# Patient Record
Sex: Male | Born: 2000 | Race: Black or African American | Hispanic: No | Marital: Single | Smoking: Never smoker
Health system: Southern US, Community
[De-identification: ages and names within clinical notes are randomized; demographics above are authoritative.]

## PROBLEM LIST (undated history)

## (undated) DIAGNOSIS — J45909 Unspecified asthma, uncomplicated: Secondary | ICD-10-CM

---
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
## 2018-03-28 IMAGING — US US ART/VEN ABD/PELV/SCROTUM DOPPLER LTD
1 series · 13 of 25 positions shown · non-contrast
Comparison: 01/18/2016

CLINICAL DATA: Scrotal pain for 6 days. Prior right testicular
torsion.

EXAM:
SCROTAL ULTRASOUND
DOPPLER ULTRASOUND OF THE TESTICLES
TECHNIQUE: Complete ultrasound examination of the testicles, epididymis, and
other scrotal structures was performed. Color and spectral Doppler
ultrasound were also utilized to evaluate blood flow to the
testicles.

[Series 1: us art/ven abd/pelv/scrotum doppler ltd · 0.06mm/px · 13 of 56 slices shown]
[im 1/56]
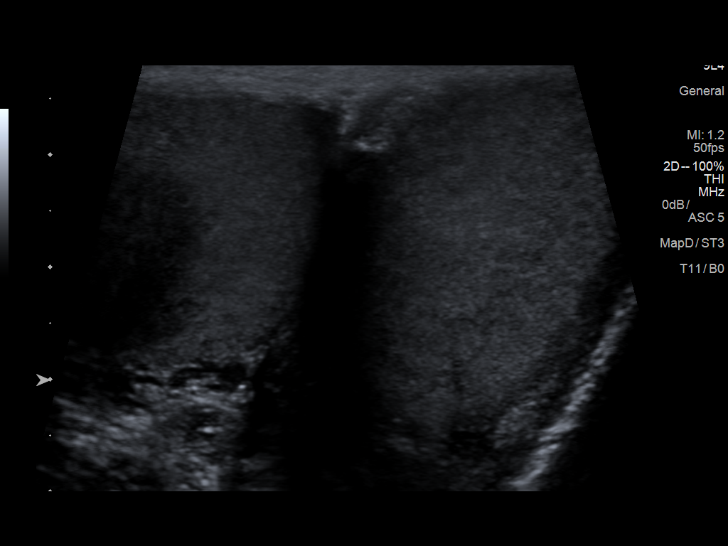
[im 5/56]
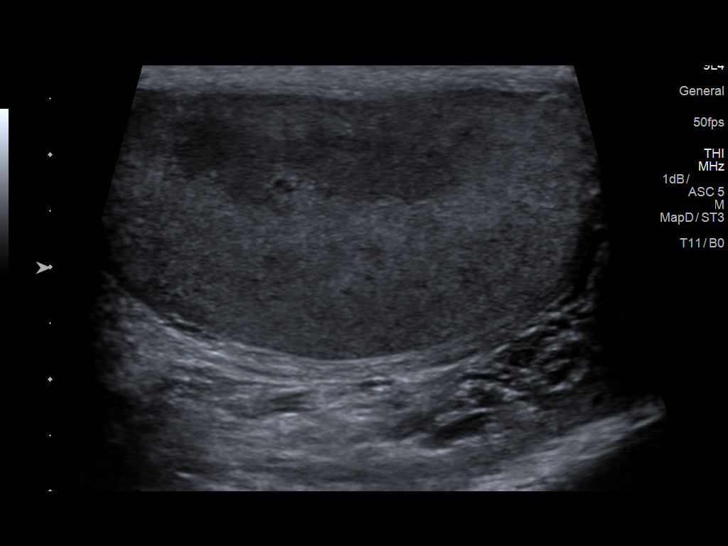
[im 10/56]
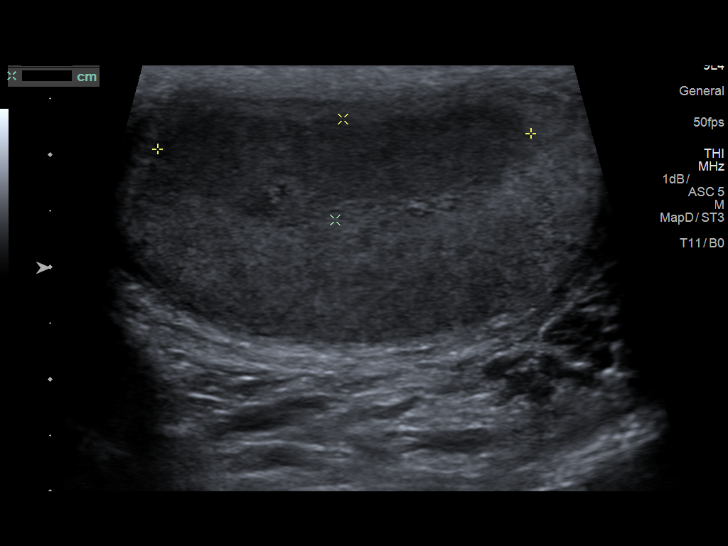
[im 14/56]
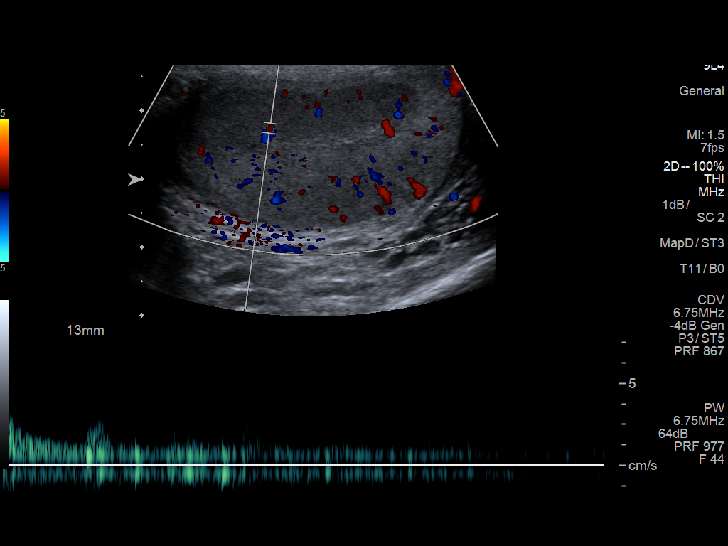
[im 19/56]
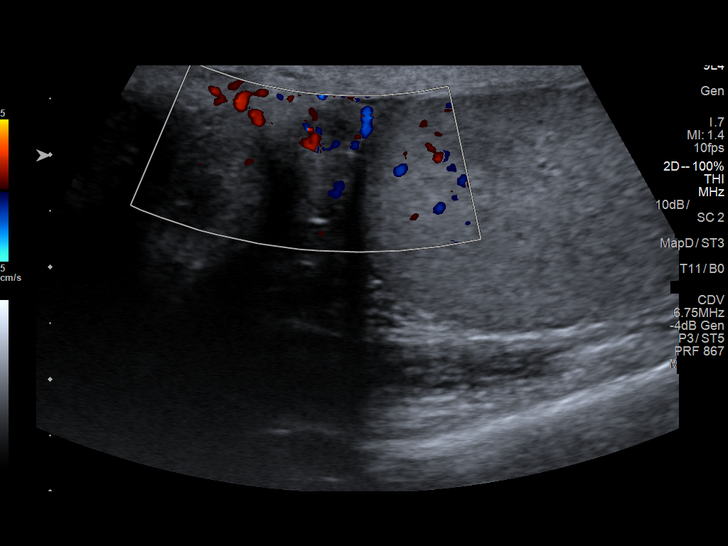
[im 23/56]
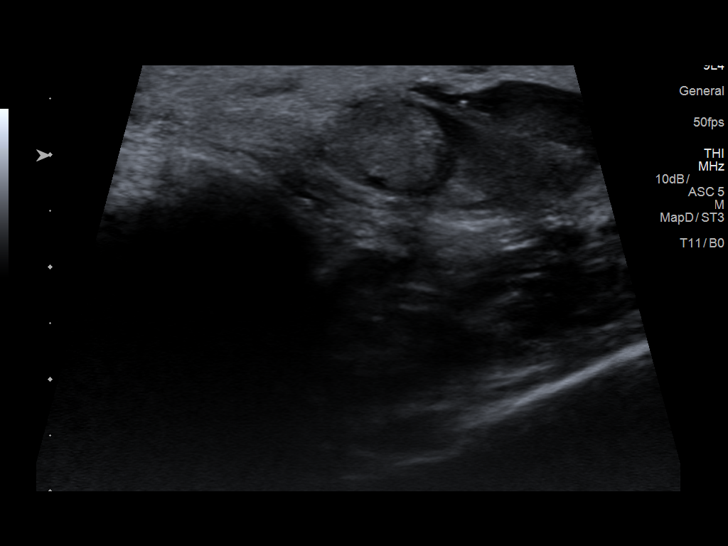
[im 28/56]
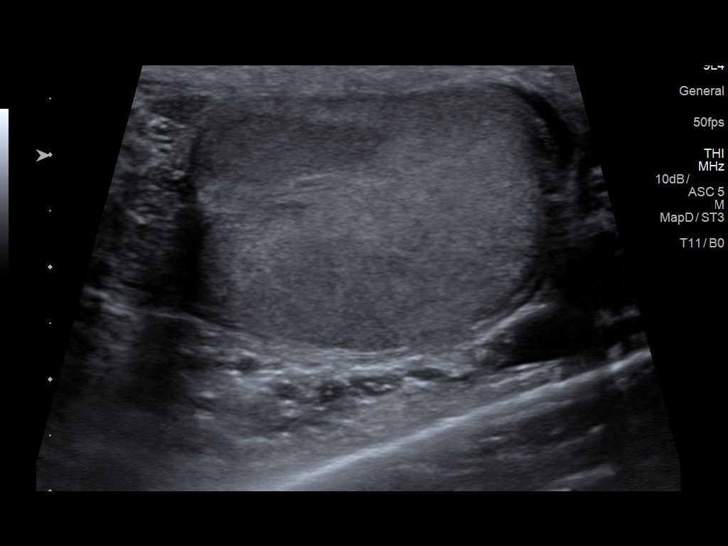
[im 33/56]
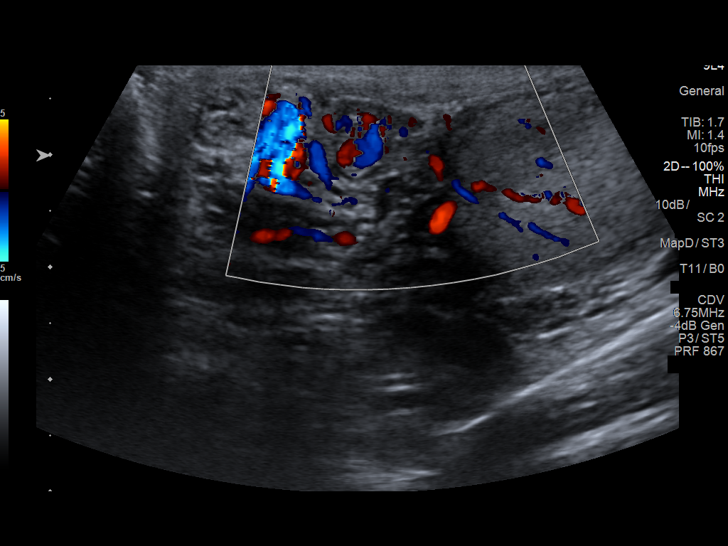
[im 37/56]
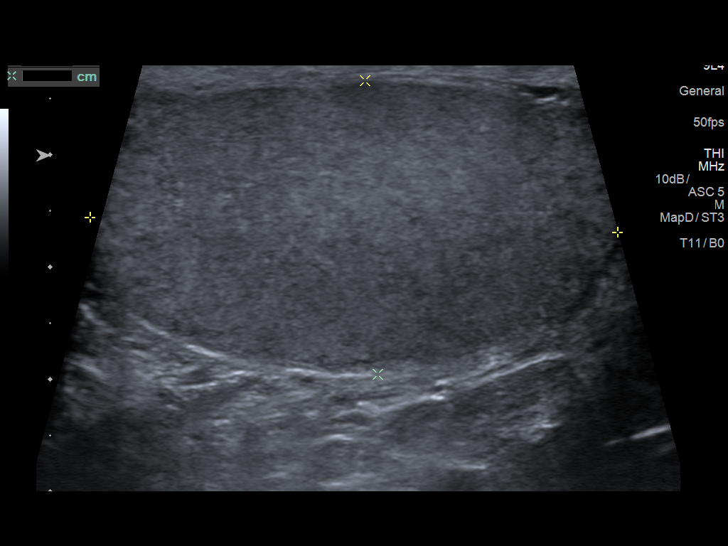
[im 42/56]
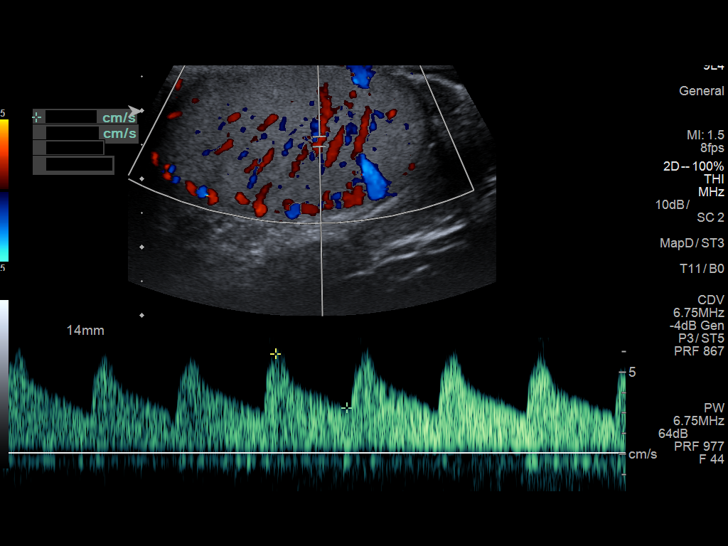
[im 46/56]
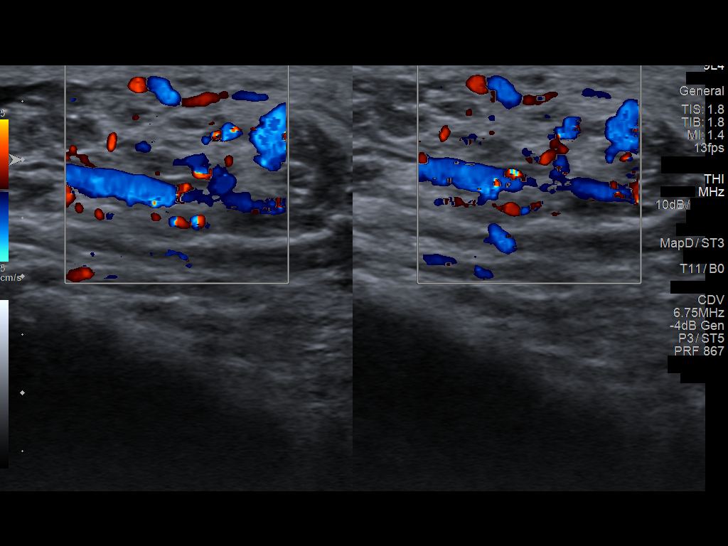
[im 51/56]
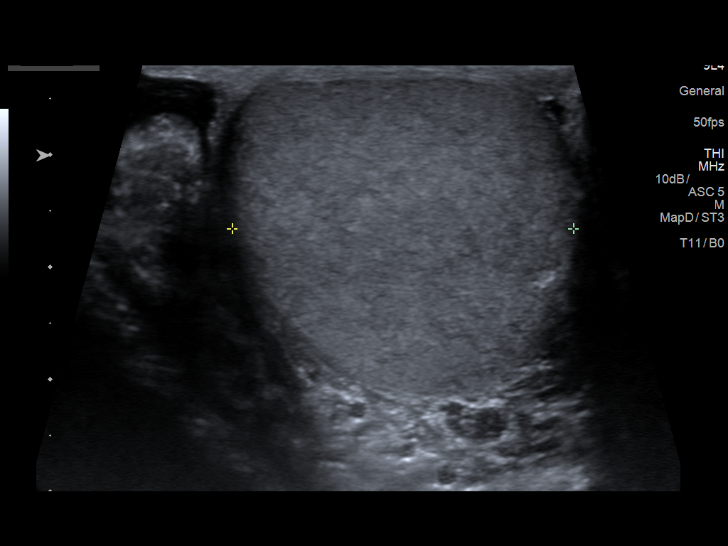
[im 56/56]
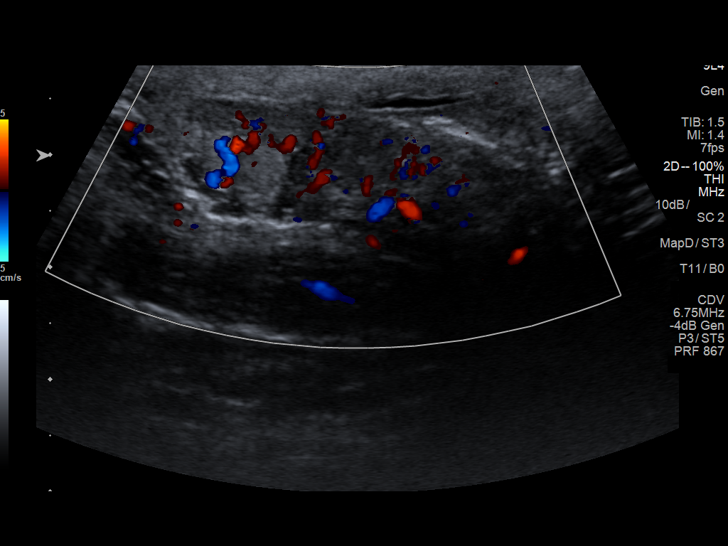

[13 of 25 positions shown; findings below may reference images not displayed]

FINDINGS: Right testicle

Measurements: 4.3 x 2.3 x 3.07. There is a focal hypodense
alteration of echotexture in an area measuring 3.3 x 0.9 x 1.7 cm.
There is overlying focal concavity of the testicle, suggesting
chronic tissue loss rather than a space-occupying mass. This may
represent scarring or other long-term sequela from the prior
torsion. No significant hyperemia or other vascular abnormality in
this portion of the testis.

Left testicle

Measurements: 4.7 x 2.6 x 3.0 cm. No mass or microlithiasis
visualized.

Right epididymis:  3 mm epididymal head cyst.

Left epididymis:  Normal in size and appearance.

Hydrocele:  Small left hydrocele

Varicocele:  None visualized.

Pulsed Doppler interrogation of both testes demonstrates normal low
resistance arterial and venous waveforms bilaterally.
IMPRESSION: 1. Intact perfusion of both testes.
2. Focal hypodense area within the right testis most likely
represents sequelae from the prior torsion. Conservative follow-up
would be appropriate to assure stability, although if there is a
palpable mass this finding should be viewed with greater suspicion.
3. Small left hydrocele.
4. 3 mm right epididymal head cyst, likely incidental.

## 2019-01-20 ENCOUNTER — Ambulatory Visit (INDEPENDENT_AMBULATORY_CARE_PROVIDER_SITE_OTHER): Payer: 59 | Admitting: Allergy & Immunology

## 2019-01-20 ENCOUNTER — Encounter: Payer: Self-pay | Admitting: Allergy & Immunology

## 2019-01-20 ENCOUNTER — Other Ambulatory Visit: Payer: Self-pay

## 2019-01-20 VITALS — BP 112/84 | HR 74 | Temp 98.0°F | Resp 18

## 2019-01-20 DIAGNOSIS — J4520 Mild intermittent asthma, uncomplicated: Secondary | ICD-10-CM

## 2019-01-20 DIAGNOSIS — J3089 Other allergic rhinitis: Secondary | ICD-10-CM

## 2019-01-20 DIAGNOSIS — J302 Other seasonal allergic rhinitis: Secondary | ICD-10-CM | POA: Diagnosis not present

## 2019-01-20 MED ORDER — ALBUTEROL SULFATE 108 (90 BASE) MCG/ACT IN AEPB: 1.0000 | INHALATION_SPRAY | Freq: Four times a day (QID) | RESPIRATORY_TRACT | 1 refills | Status: DC | PRN

## 2019-01-20 MED ORDER — ALBUTEROL SULFATE (2.5 MG/3ML) 0.083% IN NEBU: 2.5000 mg | INHALATION_SOLUTION | Freq: Four times a day (QID) | RESPIRATORY_TRACT | 1 refills | Status: AC | PRN

## 2019-01-20 MED ORDER — CETIRIZINE HCL 10 MG PO TABS: 10.0000 mg | ORAL_TABLET | Freq: Every day | ORAL | 5 refills | Status: DC

## 2019-01-20 NOTE — Patient Instructions (Addendum)
1. Mild intermittent asthma, uncomplicated - Stop the Singulair since you are not using it anyway. - Continue with albuterol inhaler or neb every 4-6 hours as needed. - If you are needing it more than 2-3 times per week, let us know since you would need a different inhaler (a controller or maintenance inhaler).  2. Seasonal and perennial allergic rhinitis - Stop the Claritin and replace with Zyrtec (cetirizine) 10mg  daily. - Zyrtec is stronger than Claritin and should work well. - Continue with a nasal steroid 1-2 sprays per nostril daily during the worst times of the year.  3. Return if symptoms worsen or fail to improve.    Please inform us of any Emergency Department visits, hospitalizations, or changes in symptoms. Call us before going to the ED for breathing or allergy symptoms since we might be able to fit you in for a sick visit. Feel free to contact us anytime with any questions, problems, or concerns.  It was a pleasure to see you again today! Let us know when you get an allergy doctor in [facility] and we can send our records there. Good luck with everything!   Websites that have reliable patient information: 1. American Academy of Asthma, Allergy, and Immunology: www.aaaai.org 2. Food Allergy Research and Education (FARE): foodallergy.org 3. Mothers of Asthmatics: http://www.asthmacommunitynetwork.org 4. American College of Allergy, Asthma, and Immunology: www.acaai.org  "Like" [place] on Facebook and Instagram for our latest updates!      Make sure you are registered to vote! If you have moved or changed any of your contact information, you will need to get this updated before voting!    Voter ID laws are NOT going into effect for the General Election in November 2020! DO NOT let this stop you from exercising your right to vote!

## 2019-01-20 NOTE — Progress Notes (Signed)
FOLLOW UP  Date of Service/Encounter:  01/20/19   Assessment:   Mild intermittent asthma, uncomplicated  Seasonal and perennial allergic rhinitis - s/p five years of allergen immunotherapy   Plan/Recommendations:   1. Mild intermittent asthma, uncomplicated - Stop the Singulair since you are not using it anyway. - Continue with albuterol inhaler or neb every 4-6 hours as needed. - If you are needing it more than 2-3 times per week, let us know since you would need a different inhaler (a controller or maintenance inhaler).  2. Seasonal and perennial allergic rhinitis - Stop the Claritin and replace with Zyrtec (cetirizine) 10mg  daily. - Zyrtec is stronger than Claritin and should work well. - Continue with a nasal steroid 1-2 sprays per nostril daily during the worst times of the year.  3. Return if symptoms worsen or fail to improve.    Subjective:   Steven Mcgrath is a 18 y.o. male presenting today for follow up of  Chief Complaint  Patient presents with  . Follow-up  . Medication Refill    Steven MainlandJaden Mcgrath has a history of the following: Patient Active Problem List   Diagnosis Date Noted  . 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 Allergic rhinoconjunctivitis 05/14/2015  . Asthma 05/14/2015    History obtained from: chart review and patient.  Steven Mcgrath is a 18 y.o. male presenting for a follow up visit.  He was last seen in November 2017.  At that time, we continued albuterol as needed for his asthma as well as Singulair 10 mg daily which he only took during the worst seasons.  For his allergic rhinitis, we continued Singulair, loratadine, and Flonase.  We plan to continue shots for 5 years total through the summer 2018.  There was some concern about his allergen vials since the molds were mixed with danders, but if we re-mix the vials then we would have had to start at the very beginning.  It seems that he did continue his shots through April 2018, for a total of nearly five  years.   In the interim, he was seen in the emergency room February 2020 for dyspnea.  Chest x-ray was normal at that time.  He did require some albuterol to 3 times a day for about a week or 2 after this episode, but he has decreased use of it since that time.  Asthma/Respiratory Symptom History: He does not really take Singulair on a routine basis.  Aside from the ER visit in February 2020, he actually has done really well.  Throughout 05/23/2018, he did not require the use of his rescue inhaler nebulizer at all.  He has not been admitted to the hospital in several years, and aside from the ER visit in the emergency room visits for his breathing.  Allergic Rhinitis Symptom History: He takes loratadine 5 mg daily as well as a [employer] nasal spray.  He is unsure if the nasal spray contains a steroid or if it is just salt water.  Regardless, he has not needed antibiotics since last visit.  To using, but he has not done so yet.  It is unclear why he is on the 5 mg Claritin dosing.  As above, he is no longer taking the Singulair.  Otherwise, there have been no changes to his past medical history, surgical history, family history, or social history.  He was attending school at [employer], but he tells me that he is taken a break now.  Mental breakdown that he had  in August 2019.  He did have an ER visit at that time which does not seem to have been followed by hospitalization.  He is now moving to [facility] tomorrow to be closer to his mother who is attending law school there.  His mother is in her third year of law school there.  He is going to specialize in health law.  She to work with [employer].    Review of Systems  Constitutional: Negative.  Negative for fever, malaise/fatigue and weight loss.  HENT: Negative.  Negative for congestion, ear discharge and ear pain.   Eyes: Negative for pain, discharge and redness.  Respiratory: Negative for cough, sputum production, shortness of breath and wheezing.    Cardiovascular: Negative.  Negative for chest pain and palpitations.  Gastrointestinal: Negative for abdominal pain and heartburn.  Skin: Negative.  Negative for itching and rash.  Neurological: Negative for dizziness and headaches.  Endo/Heme/Allergies: Negative for environmental allergies. Does not bruise/bleed easily.       Objective:   Blood pressure 112/84, pulse 74, temperature 98 F (36.7 C), temperature source Temporal, resp. rate 18, SpO2 96 %. There is no height or weight on file to calculate BMI.   Physical Exam:  Physical Exam  Constitutional: He appears well-developed.  Pleasant male.  HENT:  Head: Normocephalic and atraumatic.  Right Ear: Tympanic membrane, external ear and ear canal normal.  Left Ear: Tympanic membrane, external ear and ear canal normal.  Nose: Mucosal edema present. No rhinorrhea, nasal deformity or septal deviation. No epistaxis. Right sinus exhibits no maxillary sinus tenderness and no frontal sinus tenderness. Left sinus exhibits no maxillary sinus tenderness and no frontal sinus tenderness.  Mouth/Throat: Uvula is midline and oropharynx is clear and moist. Mucous membranes are not pale and not dry.  Nasal crease present.  Eyes: Pupils are equal, round, and reactive to light. Conjunctivae and EOM are normal. Right eye exhibits no chemosis and no discharge. Left eye exhibits no chemosis and no discharge. Right conjunctiva is not injected. Left conjunctiva is not injected.  Cardiovascular: Normal rate, regular rhythm and normal heart sounds.  Respiratory: Effort normal and breath sounds normal. No accessory muscle usage. No tachypnea. No respiratory distress. He has no wheezes. He has no rhonchi. He has no rales. He exhibits no tenderness.  Moving air well in all lung fields.  Lymphadenopathy:    He has no cervical adenopathy.  Neurological: He is alert.  Skin: No abrasion, no petechiae and no rash noted. Rash is not papular, not vesicular and  not urticarial. No erythema. No pallor.  No eczematous or urticarial lesions noted.  Psychiatric: He has a normal mood and affect.     Diagnostic studies: none    Malachi Bonds, MD  [facility]

## 2019-06-26 ENCOUNTER — Other Ambulatory Visit: Payer: Self-pay | Admitting: *Deleted

## 2019-06-26 MED ORDER — CETIRIZINE HCL 10 MG PO TABS: 10.0000 mg | ORAL_TABLET | Freq: Every day | ORAL | 5 refills | Status: AC

## 2019-11-12 ENCOUNTER — Telehealth: Payer: Self-pay

## 2019-11-12 ENCOUNTER — Other Ambulatory Visit: Payer: Self-pay

## 2019-11-12 MED ORDER — MONTELUKAST SODIUM 10 MG PO TABS: 10.0000 mg | ORAL_TABLET | Freq: Every evening | ORAL | 0 refills | Status: DC | PRN

## 2019-11-12 MED ORDER — PROAIR RESPICLICK 108 (90 BASE) MCG/ACT IN AEPB: 1.0000 | INHALATION_SPRAY | Freq: Four times a day (QID) | RESPIRATORY_TRACT | 0 refills | Status: AC | PRN

## 2019-11-12 NOTE — Telephone Encounter (Signed)
Patient called for a refill on his Singulair and pro air. I have him down for a televisit with Dr Dellis Anes on 11-30-2019. Patient is in [place] for school currently.   [facility] [address] [place], [place] - [address]

## 2019-11-12 NOTE — Telephone Encounter (Signed)
Courtesy refills sent.  

## 2019-11-30 ENCOUNTER — Ambulatory Visit (INDEPENDENT_AMBULATORY_CARE_PROVIDER_SITE_OTHER): Payer: 59 | Admitting: Allergy & Immunology

## 2019-11-30 ENCOUNTER — Encounter: Payer: Self-pay | Admitting: Allergy & Immunology

## 2019-11-30 ENCOUNTER — Other Ambulatory Visit: Payer: Self-pay

## 2019-11-30 DIAGNOSIS — J4520 Mild intermittent asthma, uncomplicated: Secondary | ICD-10-CM

## 2019-11-30 DIAGNOSIS — J3089 Other allergic rhinitis: Secondary | ICD-10-CM | POA: Diagnosis not present

## 2019-11-30 DIAGNOSIS — J302 Other seasonal allergic rhinitis: Secondary | ICD-10-CM

## 2019-11-30 MED ORDER — MONTELUKAST SODIUM 10 MG PO TABS: 10.0000 mg | ORAL_TABLET | Freq: Every evening | ORAL | 12 refills | Status: AC | PRN

## 2019-11-30 MED ORDER — FLUTICASONE PROPIONATE 50 MCG/ACT NA SUSP: 2.0000 | Freq: Every day | NASAL | 12 refills | Status: AC

## 2019-11-30 NOTE — Patient Instructions (Addendum)
1. Mild intermittent asthma, uncomplicated - Continue with the Singulair since it seemed to be helping.  - Continue with albuterol inhaler or neb every 4-6 hours as needed. - If you are needing it more than 2-3 times per week, let us know since you would need a different inhaler (a controller or maintenance inhaler).  2. Seasonal and perennial allergic rhinitis - Stop the Claritin and replace with Zyrtec (cetirizine) 10mg  daily. - Zyrtec is stronger than Claritin and should work well. - Continue with a nasal steroid 1-2 sprays per nostril daily during the worst times of the year.  3.  Follow-up in December 2021.

## 2019-11-30 NOTE — Progress Notes (Signed)
RE: Steven Mcgrath MRN: [id number] DOB: Dec 07, 2000 Date of Telemedicine Visit: 11/30/2019  Referring provider: Monna Fam, MD Primary care provider: Monna Fam, MD  Chief Complaint: Follow-up (asthma)   Telemedicine Follow Up Visit via Telephone: I connected with [name] for a follow up on 11/30/19 by telephone and verified that I am speaking with the correct person using two identifiers.   I discussed the limitations, risks, security and privacy concerns of performing an evaluation and management service by telephone and the availability of in person appointments. I also discussed with the patient that there may be a patient responsible charge related to this service. The patient expressed understanding and agreed to proceed.  Patient is at home.  Provider is at the office.  Visit start time: 10:17 AM Visit end time: 10:32 AM Insurance consent/check in by: [facility] Medical consent and medical assistant/nurse: Venissa  History of Present Illness:  He is a 19 y.o. male, who is being followed for intermittent asthma and allergic rhinitis. His previous allergy office visit was in May 2020 with myself.  At that time, his asthma was under good control.  We did recommend stopping the Singulair since he was not using it anyway.  We continue with albuterol as needed.  For his allergic rhinitis, we stopped his Claritin and started Zyrtec 10 mg daily.  We also continued with the nasal steroid as needed.  He was in the midst of moving to [facility] [place], to live with his mother.  He still lives in [facility]. He is living with his mother. He is in school now and getting a degree in mathematics.  He is planning to move back to [place] at the end of the summer.  He will be going back to [employer] [facility] [facility] [employer].  Asthma/Respiratory Symptom History: In the past month, he has had some issues with chest tightness and SOB. He is unusre of the trigger. He used his nebulizer for a week or  two twice daily with improvement. He did not need to go to the ED or UC for his symptoms. Aside from that episode, he does not remember the last time that he needed his albuterol.   Allergic Rhinitis Symptom History: He has not required any antibiotics. He has been sneezing more since moving to [place]. He endorses some postnasal drip. He does have saline spray but this does not help as much.   He is leaving at the end of the summer to come back to [facility]. He is going to be going back to [facility].   Otherwise, there have been no changes to his past medical history, surgical history, family history, or social history.  Assessment and Plan:  [name] is a 19 y.o. male with:   Mild intermittent asthma, uncomplicated  Seasonal and perennial allergic rhinitis - s/p five years of allergen immunotherapy   1. Mild intermittent asthma, uncomplicated - Continue with the Singulair since it seemed to be helping.  - Continue with albuterol inhaler or neb every 4-6 hours as needed. - If you are needing it more than 2-3 times per week, let us know since you would need a different inhaler (a controller or maintenance inhaler).  2. Seasonal and perennial allergic rhinitis - Stop the Claritin and replace with Zyrtec (cetirizine) 10mg  daily. - Zyrtec is stronger than Claritin and should work well. - Continue with a nasal steroid 1-2 sprays per nostril daily during the worst times of the year.  3.  Follow-up in December 2021.  Diagnostics: None.  Medication List:  Current Outpatient Medications  Medication Sig Dispense Refill  . albuterol (PROVENTIL) (2.5 MG/3ML) 0.083% nebulizer solution Take 3 mLs (2.5 mg total) by nebulization every 6 (six) hours as needed for wheezing or shortness of breath. 75 mL 1  . Albuterol Sulfate (PROAIR RESPICLICK) 108 (90 Base) MCG/ACT AEPB Inhale 1-2 puffs into the lungs every 6 (six) hours as needed. 1 each 0  . ARIPiprazole (ABILIFY) 15 MG tablet Take by mouth.    Marland Kitchen buPROPion  (WELLBUTRIN XL) 300 MG 24 hr tablet Take by mouth.    . cetirizine (ZYRTEC) 10 MG tablet Take 1 tablet (10 mg total) by mouth daily. 30 tablet 5  . FLUoxetine (PROZAC) 40 MG capsule Take by mouth.    . montelukast (SINGULAIR) 10 MG tablet Take 1 tablet (10 mg total) by mouth at bedtime as needed. 30 tablet 12  . Multiple Vitamin (QUINTABS) TABS Take by mouth.    . CLARAVIS 40 MG capsule Take 80 mg by mouth daily.    Marland Kitchen EPINEPHrine (AUVI-Q) 0.3 mg/0.3 mL IJ SOAJ injection Inject 0.3 mLs (0.3 mg total) into the muscle once. 4 Device 1  . fluticasone (FLONASE) 50 MCG/ACT nasal spray Place 2 sprays into both nostrils daily. 1 g 12  . loratadine (CLARITIN) 10 MG tablet Take 1 tablet (10 mg total) by mouth daily. 30 tablet 12   No current facility-administered medications for this visit.   Allergies: Allergies  Allergen Reactions  . Pollen Extract    I reviewed his past medical history, social history, family history, and environmental history and no significant changes have been reported from previous visits.  Review of Systems  Constitutional: Negative for chills, diaphoresis, fatigue and fever.  HENT: Negative for congestion, ear discharge, ear pain, facial swelling, postnasal drip, rhinorrhea, sinus pressure, sinus pain and sore throat.   Eyes: Negative for pain, discharge and itching.  Respiratory: Negative for apnea, cough, chest tightness and shortness of breath.   Cardiovascular: Negative for chest pain.  Gastrointestinal: Negative for diarrhea and nausea.  Endocrine: Negative for cold intolerance and heat intolerance.  Musculoskeletal: Negative for arthralgias and myalgias.  Skin: Negative for rash.  Allergic/Immunologic: Negative for environmental allergies and food allergies.    Objective:  Physical exam not obtained as encounter was done via telephone.   Previous notes and tests were reviewed.   I discussed the assessment and treatment plan with the patient. The patient was  provided an opportunity to ask questions and all were answered. The patient agreed with the plan and demonstrated an understanding of the instructions.   The patient was advised to call back or seek an in-person evaluation if the symptoms worsen or if the condition fails to improve as anticipated.  I provided 15 minutes of non-face-to-face time during this encounter.  It was my pleasure to participate in Hamburg Loss's care today. Please feel free to contact me with any questions or concerns.   Sincerely,  Alfonse Spruce, MD

## 2020-08-23 ENCOUNTER — Ambulatory Visit: Payer: Self-pay | Admitting: Allergy & Immunology

## 2021-01-27 ENCOUNTER — Other Ambulatory Visit: Payer: Self-pay | Admitting: Allergy & Immunology

## 2021-01-27 NOTE — Telephone Encounter (Signed)
Patient was last seen March of 2021 and was to return for an OV December of 2021
